# Patient Record
Sex: Male | Born: 1990 | Race: White | Hispanic: No | Marital: Single | Smoking: Current every day smoker
Health system: Southern US, Community
[De-identification: ages and names within clinical notes are randomized; demographics above are authoritative.]

---
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
## 2010-12-30 ENCOUNTER — Emergency Department (HOSPITAL_COMMUNITY)
Admission: EM | Admit: 2010-12-30 | Discharge: 2010-12-30 | Payer: Self-pay | Source: Home / Self Care | Admitting: Emergency Medicine

## 2013-03-30 ENCOUNTER — Emergency Department (HOSPITAL_COMMUNITY): Payer: No Typology Code available for payment source

## 2013-03-30 ENCOUNTER — Emergency Department (HOSPITAL_COMMUNITY)
Admission: EM | Admit: 2013-03-30 | Discharge: 2013-03-30 | Disposition: A | Discharge status: Home / Self Care | Payer: No Typology Code available for payment source | Attending: Emergency Medicine | Admitting: Emergency Medicine

## 2013-03-30 ENCOUNTER — Encounter (HOSPITAL_COMMUNITY): Payer: Self-pay | Admitting: *Deleted

## 2013-03-30 DIAGNOSIS — S46812A Strain of other muscles, fascia and tendons at shoulder and upper arm level, left arm, initial encounter: Secondary | ICD-10-CM

## 2013-03-30 DIAGNOSIS — S81012A Laceration without foreign body, left knee, initial encounter: Secondary | ICD-10-CM

## 2013-03-30 DIAGNOSIS — Y9289 Other specified places as the place of occurrence of the external cause: Secondary | ICD-10-CM | POA: Insufficient documentation

## 2013-03-30 DIAGNOSIS — S8000XA Contusion of unspecified knee, initial encounter: Secondary | ICD-10-CM | POA: Insufficient documentation

## 2013-03-30 DIAGNOSIS — S43499A Other sprain of unspecified shoulder joint, initial encounter: Secondary | ICD-10-CM | POA: Insufficient documentation

## 2013-03-30 DIAGNOSIS — S81009A Unspecified open wound, unspecified knee, initial encounter: Secondary | ICD-10-CM | POA: Insufficient documentation

## 2013-03-30 DIAGNOSIS — Y9389 Activity, other specified: Secondary | ICD-10-CM | POA: Insufficient documentation

## 2013-03-30 DIAGNOSIS — Z23 Encounter for immunization: Secondary | ICD-10-CM | POA: Insufficient documentation

## 2013-03-30 DIAGNOSIS — S8002XA Contusion of left knee, initial encounter: Secondary | ICD-10-CM

## 2013-03-30 DIAGNOSIS — F17200 Nicotine dependence, unspecified, uncomplicated: Secondary | ICD-10-CM | POA: Insufficient documentation

## 2013-03-30 MED ORDER — METHOCARBAMOL 500 MG PO TABS: 500.0000 mg | ORAL_TABLET | Freq: Two times a day (BID) | ORAL | Status: DC

## 2013-03-30 MED ORDER — TETANUS-DIPHTH-ACELL PERTUSSIS 5-2.5-18.5 LF-MCG/0.5 IM SUSP
0.5000 mL | Freq: Once | INTRAMUSCULAR | Status: AC
  Administered 2013-03-30: 0.5 mL via INTRAMUSCULAR
  Filled 2013-03-30: qty 0.5

## 2013-03-30 MED ORDER — IBUPROFEN 800 MG PO TABS: 800.0000 mg | ORAL_TABLET | Freq: Three times a day (TID) | ORAL | Status: DC | PRN
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
## 2013-03-30 NOTE — ED Provider Notes (Signed)
Medical screening examination/treatment/procedure(s) were performed by non-physician practitioner and as supervising physician I was immediately available for consultation/collaboration.  Aveon Colquhoun, MD 03/30/13 0714 
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
## 2013-04-29 ENCOUNTER — Emergency Department (HOSPITAL_COMMUNITY): Payer: No Typology Code available for payment source

## 2013-04-29 ENCOUNTER — Emergency Department (HOSPITAL_COMMUNITY)
Admission: EM | Admit: 2013-04-29 | Discharge: 2013-04-29 | Disposition: A | Discharge status: Home / Self Care | Payer: No Typology Code available for payment source | Attending: Emergency Medicine | Admitting: Emergency Medicine

## 2013-04-29 ENCOUNTER — Encounter (HOSPITAL_COMMUNITY): Payer: Self-pay

## 2013-04-29 DIAGNOSIS — Y9389 Activity, other specified: Secondary | ICD-10-CM | POA: Insufficient documentation

## 2013-04-29 DIAGNOSIS — F17200 Nicotine dependence, unspecified, uncomplicated: Secondary | ICD-10-CM | POA: Insufficient documentation

## 2013-04-29 DIAGNOSIS — M79641 Pain in right hand: Secondary | ICD-10-CM

## 2013-04-29 DIAGNOSIS — S298XXA Other specified injuries of thorax, initial encounter: Secondary | ICD-10-CM | POA: Insufficient documentation

## 2013-04-29 DIAGNOSIS — S6990XA Unspecified injury of unspecified wrist, hand and finger(s), initial encounter: Secondary | ICD-10-CM | POA: Insufficient documentation

## 2013-04-29 DIAGNOSIS — R079 Chest pain, unspecified: Secondary | ICD-10-CM

## 2013-04-29 MED ORDER — IBUPROFEN 800 MG PO TABS: 800.0000 mg | ORAL_TABLET | Freq: Three times a day (TID) | ORAL | Status: DC
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
## 2013-05-01 NOTE — ED Provider Notes (Signed)
Medical screening examination/treatment/procedure(s) were performed by non-physician practitioner and as supervising physician I was immediately available for consultation/collaboration.   Loren Racer, MD 05/01/13 [phone]

## 2013-11-09 ENCOUNTER — Encounter (HOSPITAL_COMMUNITY): Payer: Self-pay | Admitting: Emergency Medicine

## 2013-11-09 ENCOUNTER — Emergency Department (HOSPITAL_COMMUNITY)
Admission: EM | Admit: 2013-11-09 | Discharge: 2013-11-09 | Disposition: A | Discharge status: Home / Self Care | Payer: Self-pay | Attending: Emergency Medicine | Admitting: Emergency Medicine

## 2013-11-09 ENCOUNTER — Emergency Department (HOSPITAL_COMMUNITY): Payer: Self-pay

## 2013-11-09 DIAGNOSIS — M545 Low back pain, unspecified: Secondary | ICD-10-CM | POA: Insufficient documentation

## 2013-11-09 DIAGNOSIS — M549 Dorsalgia, unspecified: Secondary | ICD-10-CM

## 2013-11-09 DIAGNOSIS — F17200 Nicotine dependence, unspecified, uncomplicated: Secondary | ICD-10-CM | POA: Insufficient documentation

## 2013-11-09 MED ORDER — CYCLOBENZAPRINE HCL 10 MG PO TABS: 10.0000 mg | ORAL_TABLET | Freq: Two times a day (BID) | ORAL | Status: AC | PRN

## 2013-11-09 MED ORDER — NAPROXEN 500 MG PO TABS: 500.0000 mg | ORAL_TABLET | Freq: Two times a day (BID) | ORAL | Status: AC

## 2013-11-09 NOTE — ED Notes (Signed)
Pt works at  junk [employer]  And is unable to perform his work duties. Pt complains of right back pain Pt states his pain flares up every 10 min.

## 2013-11-09 NOTE — ED Notes (Signed)
Pt denies urinary symptoms. No hx of kidney stones.

## 2013-11-09 NOTE — ED Provider Notes (Signed)
CSN: [id number]     Arrival date & time 11/09/13  0945 History   First MD Initiated Contact with Patient 11/09/13 1015     No chief complaint on file.  (Consider location/radiation/quality/duration/timing/severity/associated sxs/prior Treatment) The history is provided by the patient and a parent. No language interpreter was used.  CRESPIN FORSTROM is a 22 year old male with no significant past medical history presenting to emergency department, with mother, regarding lower back pain that has been ongoing for the past week. As per patient, reported that the pain is localized in the middle of his back, lower described as a sharp shooting pain that occurs every 10 minutes. Patient reports that the pain is localized to this area without radiation. Patient reports that nothing makes the pain better, reports that pain is worsens with picking up objects and bending over. Patient reports that he's been using ibuprofen with minimal relief. 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 Patient denied any previous history of back pain. Denied falls, injury, numbness and tingling to the extremities, neck pain, neck stiffness, weakness, dizziness, headache, chest pain, shortness of breath, difficulty breathing, fever, IV drug abuse, urinary and bowel incontinence. PCP none  History reviewed. No pertinent past medical history. History reviewed. No pertinent past surgical history. History reviewed. No pertinent family history. History  Substance Use Topics  . Smoking status: Current Every Day Smoker    Types: Cigarettes  . Smokeless tobacco: Not on file  . Alcohol Use: No    Review of Systems  Constitutional: Negative for fever and chills.  Respiratory: Negative for chest tightness and shortness of breath.   Cardiovascular: Negative for chest pain.  Genitourinary: Negative for urgency and decreased urine  volume.  Musculoskeletal: Positive for back pain. Negative for neck pain.  Neurological: Negative for dizziness, weakness and headaches.  All other systems reviewed and are negative.    Allergies  Review of patient's allergies indicates no known allergies.  Home Medications   Current Outpatient Rx  Name  Route  Sig  Dispense  Refill  . ibuprofen (ADVIL,MOTRIN) 200 MG tablet   Oral   Take 800 mg by mouth every 6 (six) hours as needed for fever, headache, mild pain or moderate pain.         . cyclobenzaprine (FLEXERIL) 10 MG tablet   Oral   Take 1 tablet (10 mg total) by mouth 2 (two) times daily as needed for muscle spasms.   20 tablet   0   . naproxen (NAPROSYN) 500 MG tablet   Oral   Take 1 tablet (500 mg total) by mouth 2 (two) times daily.   30 tablet   0    BP 112/49  Pulse 56  Temp(Src) 98 F (36.7 C) (Oral)  Resp 16  SpO2 99% Physical Exam  Nursing note and vitals reviewed. Constitutional: He is oriented to person, place, and time. He appears well-developed and well-nourished. No distress.  HENT:  Head: Normocephalic and atraumatic.  Neck: Normal range of motion. Neck supple.  Negative pain upon palpation to the C-spine Negative neck stiffness  Cardiovascular: Normal rate, regular rhythm and normal heart sounds.  Exam reveals no friction rub.   No murmur heard. Pulses:      Radial pulses are 2+ on the right side, and 2+ on the left side.       Dorsalis pedis pulses are 2+ on  the right side, and 2+ on the left side.  Pulmonary/Chest: Effort normal and breath sounds normal. No respiratory distress. He has no wheezes. He has no rales.  Musculoskeletal: Normal range of motion.       Back:  Negative swelling, erythema, inflammation, sores, lesions, bulging, deformities noted to the cervical, thoracic, lumbar spine. Discomfort upon palpation to mid spinal region localized to the lower thoracic upper lumbar region. Negative paraspinal discomfort identified.  Discomfort with rotation of torso and flexion. Patient able to bear weight on both legs without difficulty. Full range of motion to upper and lower extremities bilaterally without difficulty noted.  Neurological: He is alert and oriented to person, place, and time. He exhibits normal muscle tone. Coordination normal.  Strength 5+/5+ to upper and lower extremities bilaterally with resistance applied, equal distribution noted Sensation intact with differentiation to sharp and dull touch to upper and lower extremities bilaterally Stance strong, negative sway Gait proper, proper balance Negative step off  Skin: Skin is warm and dry. No rash noted. He is not diaphoretic. No erythema.  Psychiatric: He has a normal mood and affect. His behavior is normal. Thought content normal.    ED Course  Procedures (including critical care time) Labs Review Labs Reviewed - No data to display Imaging Review Dg Thoracic Spine 2 View  11/09/2013   CLINICAL DATA:  No injury.  Back pain.  EXAM: THORACIC SPINE - 2 VIEW  COMPARISON:  Chest x-ray 04/29/2013  FINDINGS: There is no evidence of thoracic spine fracture. Alignment is normal. No other significant bone abnormalities are identified.  IMPRESSION: Negative.   Electronically Signed   By: Charlett Nose M.D.   On: 11/09/2013 11:15   Dg Lumbar Spine Complete  11/09/2013   CLINICAL DATA:  Lower back pain.  EXAM: LUMBAR SPINE - COMPLETE 4+ VIEW  COMPARISON:  None.  FINDINGS: There is no evidence of lumbar spine fracture. Alignment is normal. Intervertebral disc spaces are maintained.  IMPRESSION: Negative.   Electronically Signed   By: Salome Holmes M.D.   On: 11/09/2013 11:11    EKG Interpretation   None       MDM   1. Back pain     Filed Vitals:   11/09/13 0950 11/09/13 0959 11/09/13 1000  BP: 123/66 119/58 112/49  Pulse: 56 56   Temp: 97.5 F (36.4 C) 98 F (36.7 C)   TempSrc: Oral Oral   Resp: 16 16   SpO2: 100% 99%     Patient presenting  to emergency department with back pain has been ongoing for the past week. Alert and oriented. GCS 15. Negative deformities noted to the cervical, thoracic, lumbar sacral spine. Discomfort upon palpation to lower thoracic, upper lumbar region of the spine-mid spinal region-negative pain upon palpation to bilateral paraspinal regions. Full range of motion to upper and lower extremities bilaterally without difficulty. Discomfort noted with rotation of the torso and flexion. Strength intact. Sensation intact. Pulses palpable. Negative neurological deficits noted. Plain films of thoracic and lumbar spine negative for abnormalities. Alignment is normal, and vertebral disc spaces are maintained. Doubt epidural abscess. Doubt cauda equina syndrome. Suspicion to be back pain secondary to work. Patient stable, afebrile. Discharged patient. Discharged patient with anti-inflammatories and muscle relaxers. Referred patient to orthopedics. Recommended back brace. Discussed with patient to avoid any strenuous physical activity. Discussed with patient to closely monitor symptoms and if symptoms are to worsen or change report back to emergency department - strict return instructions given. Patient agreed  to plan of care, understood, all questions answered.    Raymon Mutton, PA-C 11/10/13 1520

## 2013-11-11 NOTE — ED Provider Notes (Signed)
Medical screening examination/treatment/procedure(s) were performed by non-physician practitioner and as supervising physician I was immediately available for consultation/collaboration.  EKG Interpretation   None         Sameka Bagent, MD 11/11/13 0644 

## 2017-12-01 ENCOUNTER — Encounter (HOSPITAL_COMMUNITY): Payer: Self-pay

## 2017-12-01 ENCOUNTER — Emergency Department (HOSPITAL_COMMUNITY)
Admission: EM | Admit: 2017-12-01 | Discharge: 2017-12-01 | Disposition: A | Discharge status: Home / Self Care | Payer: Self-pay | Attending: Emergency Medicine | Admitting: Emergency Medicine

## 2017-12-01 ENCOUNTER — Other Ambulatory Visit: Payer: Self-pay

## 2017-12-01 DIAGNOSIS — K029 Dental caries, unspecified: Secondary | ICD-10-CM | POA: Insufficient documentation

## 2017-12-01 DIAGNOSIS — K047 Periapical abscess without sinus: Secondary | ICD-10-CM | POA: Insufficient documentation

## 2017-12-01 DIAGNOSIS — F17210 Nicotine dependence, cigarettes, uncomplicated: Secondary | ICD-10-CM | POA: Insufficient documentation

## 2017-12-01 DIAGNOSIS — Z79899 Other long term (current) drug therapy: Secondary | ICD-10-CM | POA: Insufficient documentation

## 2017-12-01 MED ORDER — IBUPROFEN 600 MG PO TABS: 600.0000 mg | ORAL_TABLET | Freq: Four times a day (QID) | ORAL | 0 refills | Status: DC | PRN

## 2017-12-01 MED ORDER — PENICILLIN V POTASSIUM 500 MG PO TABS: 500.0000 mg | ORAL_TABLET | Freq: Three times a day (TID) | ORAL | 0 refills | Status: AC

## 2017-12-01 NOTE — ED Triage Notes (Addendum)
Onset 3 days upper front tooth pain and right side facial swelling.

## 2017-12-01 NOTE — ED Provider Notes (Signed)
[facility]   CSN: [id number] Arrival date & time: 12/01/17  2152     History   Chief Complaint Chief Complaint  Patient presents with  . Dental Pain    HPI Danny Carter is a 26 y.o. male.  HPI   26 year old male presenting for evaluation of dental pain.  Patient report for the past 3 days he has had progressive worsening pain to his right upper teeth.  Pain is described as a achy throbbing sensation, persistent, sometimes worsen with temperature change.  Increasing pain with chewing.  He has been taking Tylenol and ibuprofen at home with some relief.  He has had dental pain to other teeth in the past.  He knows he needs to follow-up with a dentist but unable to afford at this time.  He is currently waiting for Medicaid.  He denies any associated fever, trouble swallowing, or neck pain.  No chest pain or trouble breathing.  He is a smoker.  History reviewed. No pertinent past medical history.  There are no active problems to display for this patient.   History reviewed. No pertinent surgical history.     Home Medications    Prior to Admission medications   Medication Sig Start Date End Date Taking? Authorizing Provider  cyclobenzaprine (FLEXERIL) 10 MG tablet Take 1 tablet (10 mg total) by mouth 2 (two) times daily as needed for muscle spasms. 11/09/13   Sciacca, Marissa, PA-C  ibuprofen (ADVIL,MOTRIN) 200 MG tablet Take 800 mg by mouth every 6 (six) hours as needed for fever, headache, mild pain or moderate pain.    [provider]  naproxen (NAPROSYN) 500 MG tablet Take 1 tablet (500 mg total) by mouth 2 (two) times daily. 11/09/13   Raymon MuttonSciacca, Marissa, PA-C    Family History History reviewed. No pertinent family history.  Social History Social History   Tobacco Use  . Smoking status: Current Every Day Smoker    Packs/day: 0.50    Types: Cigarettes  . Smokeless tobacco: Never Used  Substance Use Topics   . Alcohol use: No  . Drug use: No     Allergies   Patient has no known allergies.   Review of Systems Review of Systems  Constitutional: Negative for fever.  HENT: Positive for dental problem.      Physical Exam Updated Vital Signs BP 118/66   Pulse 75   Temp 97.9 F (36.6 C) (Oral)   Resp 16   SpO2 100%   Physical Exam  Constitutional: He appears well-developed and well-nourished. No distress.  HENT:  Head: Atraumatic.  Widespread dental decay in mouth with tenderness to tooth #6-9.  Upper gumline is edematous with associated facial swelling.  No trismus.  Eyes: Conjunctivae are normal.  Neck: Neck supple.  Neurological: He is alert.  Skin: No rash noted.  Psychiatric: He has a normal mood and affect.  Nursing note and vitals reviewed.    ED Treatments / Results  Labs (all labs ordered are listed, but only abnormal results are displayed) Labs Reviewed - No data to display  EKG  EKG Interpretation None       Radiology No results found.  Procedures Procedures (including critical care time)  Medications Ordered in ED Medications - No data to display   Initial Impression / Assessment and Plan / ED Course  I have reviewed the triage vital signs and the nursing notes.  Pertinent labs & imaging results that were available during my  care of the patient were reviewed by me and considered in my medical decision making (see chart for details).     BP 118/66   Pulse 75   Temp 97.9 F (36.6 C) (Oral)   Resp 16   SpO2 100%    Final Clinical Impressions(s) / ED Diagnoses   Final diagnoses:  Infected dental caries    ED Discharge Orders    None     Pt presents c/o dental pain. There is no evidence of abscess, no trismus, and the uvula is midline.  The patient will be given prescriptions for antibiotics and analgesics.  The patient was counseled to follow up with a dentist for further treatment.  The patient was informed to return to the ED  if there is interval development of hoarseness, trismus, erythema, difficulty breathing, swelling of the neck or worsening pain. The patient verbalized understanding of the plan and agrees.  The patient was provided the opportunity to ask questions.  All questions were answered.    Fayrene Helperran, Yvana Samonte, PA-C 12/01/17 2216    Raeford RazorKohut, Stephen, MD 12/04/17 1130

## 2017-12-25 ENCOUNTER — Emergency Department (HOSPITAL_COMMUNITY): Payer: Medicaid Other

## 2017-12-25 ENCOUNTER — Encounter (HOSPITAL_COMMUNITY): Payer: Self-pay | Admitting: Pharmacy Technician

## 2017-12-25 ENCOUNTER — Emergency Department (HOSPITAL_COMMUNITY)
Admission: EM | Admit: 2017-12-25 | Discharge: 2017-12-25 | Disposition: A | Discharge status: Home / Self Care | Payer: Medicaid Other | Attending: Emergency Medicine | Admitting: Emergency Medicine

## 2017-12-25 DIAGNOSIS — Z79899 Other long term (current) drug therapy: Secondary | ICD-10-CM | POA: Insufficient documentation

## 2017-12-25 DIAGNOSIS — Y999 Unspecified external cause status: Secondary | ICD-10-CM | POA: Insufficient documentation

## 2017-12-25 DIAGNOSIS — F17210 Nicotine dependence, cigarettes, uncomplicated: Secondary | ICD-10-CM | POA: Insufficient documentation

## 2017-12-25 DIAGNOSIS — Y939 Activity, unspecified: Secondary | ICD-10-CM | POA: Insufficient documentation

## 2017-12-25 DIAGNOSIS — S4991XA Unspecified injury of right shoulder and upper arm, initial encounter: Secondary | ICD-10-CM | POA: Diagnosis present

## 2017-12-25 DIAGNOSIS — Y929 Unspecified place or not applicable: Secondary | ICD-10-CM | POA: Insufficient documentation

## 2017-12-25 DIAGNOSIS — S42001A Fracture of unspecified part of right clavicle, initial encounter for closed fracture: Secondary | ICD-10-CM | POA: Diagnosis not present

## 2017-12-25 DIAGNOSIS — R55 Syncope and collapse: Secondary | ICD-10-CM | POA: Insufficient documentation

## 2017-12-25 MED ORDER — IBUPROFEN 800 MG PO TABS: 800.0000 mg | ORAL_TABLET | Freq: Three times a day (TID) | ORAL | 0 refills | Status: AC | PRN

## 2017-12-25 MED ORDER — OXYCODONE-ACETAMINOPHEN 5-325 MG PO TABS
2.0000 | ORAL_TABLET | Freq: Once | ORAL | Status: AC
Start: 2017-12-25 — End: 2017-12-25
  Administered 2017-12-25: 2 via ORAL
  Filled 2017-12-25: qty 2

## 2017-12-25 MED ORDER — OXYCODONE-ACETAMINOPHEN 5-325 MG PO TABS: 1.0000 | ORAL_TABLET | Freq: Four times a day (QID) | ORAL | 0 refills | Status: AC | PRN

## 2017-12-25 NOTE — Progress Notes (Signed)
Orthopedic Tech Progress Note Patient Details:  Domingo SepJacob B Lawry Feb 07, 1991 [id number]  Ortho Devices Type of Ortho Device: Arm sling Ortho Device/Splint Location: Right arm Ortho Device/Splint Interventions: Application, Adjustment   Post Interventions Patient Tolerated: Well Instructions Provided: Adjustment of device, Care of device   Alvina ChouWilliams, Elward Nocera C 12/25/2017, 7:00 PM
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
## 2017-12-25 NOTE — ED Notes (Signed)
Declined W/C at D/C and was escorted to lobby by RN. 

## 2017-12-25 NOTE — Discharge Instructions (Signed)
It was my pleasure taking care of you today!   Call the orthopedic clinic tomorrow morning to schedule a follow up appointment.   Ibuprofen as needed for mild to moderate pain. Percocet only as needed for severe pain - This can make you very drowsy - please do not drink alcohol, operate heavy machinery or drive on this medication.   Return to ER for new or worsening symptoms, any additional concerns.

## 2017-12-27 ENCOUNTER — Ambulatory Visit (INDEPENDENT_AMBULATORY_CARE_PROVIDER_SITE_OTHER): Payer: Medicaid Other | Admitting: Physician Assistant

## 2017-12-27 ENCOUNTER — Encounter (INDEPENDENT_AMBULATORY_CARE_PROVIDER_SITE_OTHER): Payer: Self-pay | Admitting: Physician Assistant

## 2017-12-27 DIAGNOSIS — S42021A Displaced fracture of shaft of right clavicle, initial encounter for closed fracture: Secondary | ICD-10-CM
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
## 2018-01-09 ENCOUNTER — Ambulatory Visit (INDEPENDENT_AMBULATORY_CARE_PROVIDER_SITE_OTHER): Payer: Self-pay | Admitting: Physician Assistant

## 2018-01-09 ENCOUNTER — Ambulatory Visit (INDEPENDENT_AMBULATORY_CARE_PROVIDER_SITE_OTHER): Payer: Self-pay

## 2018-01-09 ENCOUNTER — Encounter (INDEPENDENT_AMBULATORY_CARE_PROVIDER_SITE_OTHER): Payer: Self-pay | Admitting: Physician Assistant

## 2018-01-09 DIAGNOSIS — S42021A Displaced fracture of shaft of right clavicle, initial encounter for closed fracture: Secondary | ICD-10-CM

## 2018-01-09 MED ORDER — HYDROCODONE-ACETAMINOPHEN 5-325 MG PO TABS: 1.0000 | ORAL_TABLET | Freq: Four times a day (QID) | ORAL | 0 refills | Status: AC | PRN

## 2018-01-09 NOTE — Progress Notes (Signed)
Danny Carter returns today for follow-up of his right clavicle fracture.  He continues to have significant pain in the clavicle.  He does bring the arm out of his sling for range of motion.  He is asking for refill on his pain medication today.  Physical exam: Right arm is good sensation in the hand full motor.  Right clavicle he has tenderness over the clavicle shaft where there was some palpable callus.  There is no tenting of the skin.  Radiographs: Right clavicle 2 views shows no change in overall position alignment of the clavicle shaft fracture.  There are some early signs of callus formation.  No other fractures seen.  Shoulder appears well located.  Impression: Right clavicle shaft fracture 2 weeks status post injury.  Plan: He will continue to come out of the sling for range of motion of the arm forearm hand and wrist.  He is given Norco for pain that he is use sparingly.  We did place him out of work until he follows up with us in 1 month we will obtain 2 views of the clavicle at that time.  Questions encouraged and answered at length.

## 2018-02-06 ENCOUNTER — Encounter (INDEPENDENT_AMBULATORY_CARE_PROVIDER_SITE_OTHER): Payer: Self-pay | Admitting: Physician Assistant

## 2018-02-06 ENCOUNTER — Ambulatory Visit (INDEPENDENT_AMBULATORY_CARE_PROVIDER_SITE_OTHER): Payer: Medicaid Other

## 2018-02-06 ENCOUNTER — Ambulatory Visit (INDEPENDENT_AMBULATORY_CARE_PROVIDER_SITE_OTHER): Payer: Medicaid Other | Admitting: Physician Assistant

## 2018-02-06 DIAGNOSIS — S42021D Displaced fracture of shaft of right clavicle, subsequent encounter for fracture with routine healing: Secondary | ICD-10-CM

## 2018-02-06 NOTE — Progress Notes (Signed)
   Office Visit Note   Patient: Danny Carter           Date of Birth: [birth year]           MRN: [id number] Visit Date: 02/06/2018              Requested by: No referring provider defined for this encounter. PCP: Patient, No Pcp Per   Assessment & Plan: Visit Diagnoses:  1. Closed displaced fracture of shaft of right clavicle with routine healing, subsequent encounter     Plan: No heavy lifting with the right arm.  He can do activities as tolerated chest level.  I would like for him to work on gentle range of motion including forward flexion and abduction he is ranges of motion were demonstrated the patient today and included wall crawls.  We will see him back in 1 month for 2 views of the right clavicle.  Follow-Up Instructions: Return in about 4 weeks (around 03/06/2018) for Radiographs.   Orders:  Orders Placed This Encounter  Procedures  . XR Clavicle Right   No orders of the defined types were placed in this encounter.     Procedures: No procedures performed   Clinical Data: No additional findings.   Subjective: Chief Complaint  Patient presents with  . Right Shoulder - Follow-up    HPI Danny Carter returns today 6 weeks status post closed right clavicle fracture.  States overall the knee is doing okay his soreness is dissipating.  He has returned to work as of yesterday and is doing activities at chest level.  He is no longer wearing sling.  Review of Systems See HPI  Objective: Vital Signs: There were no vitals taken for this visit.  Physical Exam  Constitutional: He is oriented to person, place, and time. He appears well-developed and well-nourished. No distress.  Pulmonary/Chest: Effort normal.  Neurological: He is alert and oriented to person, place, and time.  Skin: He is not diaphoretic.    Ortho Exam Right clavicle he has palpable callus at the fracture site.  Some slight tenderness.  Abduction right shoulder 90 degrees and forward flexion to 90 degrees  actively passively I can bring him to about 170 degrees. Specialty Comments:  No specialty comments available.  Imaging: Xr Clavicle Right  Result Date: 02/06/2018 3 views right clavicle: No change in overall position alignment.  Further consolidation is seen.  Fracture is still quite evident with shortening of the overall length of the shaft.    PMFS History: There are no active problems to display for this patient.  History reviewed. No pertinent past medical history.  History reviewed. No pertinent family history.  History reviewed. No pertinent surgical history. Social History   Occupational History  . Not on file  Tobacco Use  . Smoking status: Current Every Day Smoker    Packs/day: 0.50    Types: Cigarettes  . Smokeless tobacco: Never Used  Substance and Sexual Activity  . Alcohol use: No  . Drug use: No  . Sexual activity: Not on file

## 2018-03-06 ENCOUNTER — Ambulatory Visit (INDEPENDENT_AMBULATORY_CARE_PROVIDER_SITE_OTHER): Payer: Medicaid Other | Admitting: Physician Assistant

## 2019-05-26 ENCOUNTER — Other Ambulatory Visit (HOSPITAL_COMMUNITY): Admission: RE | Admit: 2019-05-26 | Payer: Medicaid Other | Source: Ambulatory Visit

## 2019-05-26 NOTE — Progress Notes (Signed)
Spoke with Glass blower/designer at Dr. Lupita Leash office, explained unable to reach pt for surgery on Wednesday and pt needs Covid test prior to surgery.

## 2019-05-27 NOTE — Progress Notes (Signed)
Spoke with Receptionist at Dr. Lupita Leash office, told her needed to cancel pt for tomorrow. We have not heard back from pt to do history and he would need COVID test before surgery. Just got work we are not doing rapid Covid test only 72 hour Covid test for surgery. She will share information with Midwife.

## 2019-05-28 ENCOUNTER — Encounter (HOSPITAL_BASED_OUTPATIENT_CLINIC_OR_DEPARTMENT_OTHER): Admission: RE | Payer: Self-pay | Source: Home / Self Care

## 2019-05-28 ENCOUNTER — Ambulatory Visit (HOSPITAL_BASED_OUTPATIENT_CLINIC_OR_DEPARTMENT_OTHER): Admission: RE | Admit: 2019-05-28 | Payer: Medicaid Other | Source: Home / Self Care | Admitting: Oral Surgery

## 2019-05-28 SURGERY — DENTAL RESTORATION/EXTRACTIONS
Anesthesia: General
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
## 2020-05-10 ENCOUNTER — Encounter (HOSPITAL_COMMUNITY): Payer: Self-pay | Admitting: *Deleted

## 2020-05-10 ENCOUNTER — Emergency Department (HOSPITAL_COMMUNITY)
Admission: EM | Admit: 2020-05-10 | Discharge: 2020-05-10 | Disposition: A | Discharge status: Home / Self Care | Payer: Medicaid Other | Attending: Emergency Medicine | Admitting: Emergency Medicine

## 2020-05-10 DIAGNOSIS — K0889 Other specified disorders of teeth and supporting structures: Secondary | ICD-10-CM | POA: Insufficient documentation

## 2020-05-10 DIAGNOSIS — F17210 Nicotine dependence, cigarettes, uncomplicated: Secondary | ICD-10-CM | POA: Insufficient documentation

## 2020-05-10 DIAGNOSIS — K029 Dental caries, unspecified: Secondary | ICD-10-CM

## 2020-05-10 MED ORDER — AMOXICILLIN 500 MG PO CAPS: 500.0000 mg | ORAL_CAPSULE | Freq: Three times a day (TID) | ORAL | 0 refills | Status: AC

## 2020-05-10 NOTE — ED Triage Notes (Signed)
To ED for eval of left lower tooth pain. States he knows its infected. Pain started today.

## 2020-05-10 NOTE — Discharge Instructions (Signed)
Please pick up antibiotics and take as prescribed Attached is a dental resource guide - please follow up with a dentist as you may need some teeth extracted I would recommend 800 mg Ibuprofen every 8 hours and 1,000 mg Tylenol every 8 hours (alternate the medications every 4 hours - Ibuprofen then 4 hours later Tylenol then 4 hours later it will be time to take Ibuprofen again) Applying ice to your face can reduce pain and swelling as well Return to the ED for any worsening symptoms including worsening pain, facial swelling, fevers > 100.4, bad taste in your mouth/pus draining from your tooth, or any other new/concerning symptoms

## 2020-05-10 NOTE — ED Provider Notes (Signed)
[facility] EMERGENCY DEPARTMENT Provider Note   CSN: [id number] Arrival date & time: 05/10/20  1233     History Chief Complaint  Patient presents with  . Dental Pain    Danny Carter is a 29 y.o. male who presents to the ED today with complaint of gradual onset, constant, achy, left lower dental pain that began yesterday. Pt reports hx of multiple dental caries and states he will frequently get pain leading to an infection. No trauma to the teeth. Pt does not have a dentist to follow up with. He has been taking 800 mg Ibuprofen once daily as well as Tylenol once daily without relief. Pt denies any fevers, chills, drainage from tooth, foul taste in mouth, trismus, drooling, facial swelling, or any other associated symptoms.   The history is provided by the patient and medical records.       History reviewed. No pertinent past medical history.  There are no problems to display for this patient.   History reviewed. No pertinent surgical history.     No family history on file.  Social History   Tobacco Use  . Smoking status: Current Every Day Smoker    Packs/day: 0.50    Types: Cigarettes  . Smokeless tobacco: Never Used  Substance Use Topics  . Alcohol use: No  . Drug use: No    Home Medications Prior to Admission medications   Medication Sig Start Date End Date Taking? Authorizing Provider  amoxicillin (AMOXIL) 500 MG capsule Take 1 capsule (500 mg total) by mouth 3 (three) times daily. 05/10/20   Hyman Hopes, Hannan Tetzlaff, PA-C  cyclobenzaprine (FLEXERIL) 10 MG tablet Take 1 tablet (10 mg total) by mouth 2 (two) times daily as needed for muscle spasms. Patient not taking: Reported on 02/06/2018 11/09/13   Sciacca, Ashok Cordia, PA-C  HYDROcodone-acetaminophen (NORCO) 5-325 MG tablet Take 1 tablet by mouth every 6 (six) hours as needed for moderate pain. One to two tabs every 4-6 hours for pain Patient not taking: Reported on 02/06/2018 01/09/18   Kirtland Bouchard,  PA-C  ibuprofen (ADVIL,MOTRIN) 800 MG tablet Take 1 tablet (800 mg total) by mouth every 8 (eight) hours as needed for mild pain or moderate pain. 12/25/17   Ward, Chase Picket, PA-C  naproxen (NAPROSYN) 500 MG tablet Take 1 tablet (500 mg total) by mouth 2 (two) times daily. Patient not taking: Reported on 02/06/2018 11/09/13   Sciacca, Ashok Cordia, PA-C  oxyCODONE-acetaminophen (PERCOCET/ROXICET) 5-325 MG tablet Take 1-2 tablets by mouth every 6 (six) hours as needed for severe pain. Patient not taking: Reported on 02/06/2018 12/25/17   Ward, Chase Picket, PA-C  penicillin v potassium (VEETID) 500 MG tablet Take 1 tablet (500 mg total) by mouth 3 (three) times daily. Patient not taking: Reported on 02/06/2018 12/01/17   Fayrene Helper, PA-C    Allergies    Patient has no known allergies.  Review of Systems   Review of Systems  Constitutional: Negative for chills and fever.  HENT: Positive for dental problem. Negative for facial swelling, trouble swallowing and voice change.     Physical Exam Updated Vital Signs BP 124/71 (BP Location: Left Arm)   Pulse 65   Temp 98.9 F (37.2 C) (Oral)   Resp 16   Ht 5\' 11"  (1.803 m)   Wt 82.6 kg   SpO2 99%   BMI 25.38 kg/m   Physical Exam Vitals and nursing note reviewed.  Constitutional:      Appearance: He is not ill-appearing.  HENT:     Head: Normocephalic and atraumatic.     Mouth/Throat:      Comments: Nose clear.  L lower tooth #18 decayed with caries with TTP, with minimal surrounding gingival swelling and erythema, no definite abscess, no evidence of ludwig's.  Oropharynx clear and moist, without uvular swelling or deviation, no trismus or drooling, no tonsillar swelling or erythema, no exudates.   Eyes:     Conjunctiva/sclera: Conjunctivae normal.  Cardiovascular:     Rate and Rhythm: Normal rate and regular rhythm.     Heart sounds: No murmur.  Pulmonary:     Effort: Pulmonary effort is normal.     Breath sounds: Normal breath  sounds. No wheezing, rhonchi or rales.  Skin:    General: Skin is warm and dry.     Coloration: Skin is not jaundiced.  Neurological:     Mental Status: He is alert.     ED Results / Procedures / Treatments   Labs (all labs ordered are listed, but only abnormal results are displayed) Labs Reviewed - No data to display  EKG None  Radiology No results found.  Procedures Procedures (including critical care time)  Medications Ordered in ED Medications - No data to display  ED Course  I have reviewed the triage vital signs and the nursing notes.  Pertinent labs & imaging results that were available during my care of the patient were reviewed by me and considered in my medical decision making (see chart for details).    MDM Rules/Calculators/A&P                      29 year old male presenting to the ED with left lower dental pain x 1 day, hx of same with poor dentition. No trauma. On arrival to the ED pt is afebrile, nontachycardic, and nontachypneic. Appears to be in NAD. No facial swelling appreciated. No definitive dental abscess to be drained today; no concern for ludwig's angina. Pt tolerated his own secretions without difficulty. Noted dental caries throughout with tooth #18 small dental fracture and TTP. Will discharge with abx at this time and provide dental resource guide. Strict return precautions have been discussed with pt. He is in agreement with plan and stable for discharge home.   This note was prepared using Dragon voice recognition software and may include unintentional dictation errors due to the inherent limitations of voice recognition software.   Final Clinical Impression(s) / ED Diagnoses Final diagnoses:  Pain, dental  Dental caries    Rx / DC Orders ED Discharge Orders         Ordered    amoxicillin (AMOXIL) 500 MG capsule  3 times daily     05/10/20 1354           Discharge Instructions     Please pick up antibiotics and take as  prescribed Attached is a dental resource guide - please follow up with a dentist as you may need some teeth extracted I would recommend 800 mg Ibuprofen every 8 hours and 1,000 mg Tylenol every 8 hours (alternate the medications every 4 hours - Ibuprofen then 4 hours later Tylenol then 4 hours later it will be time to take Ibuprofen again) Applying ice to your face can reduce pain and swelling as well Return to the ED for any worsening symptoms including worsening pain, facial swelling, fevers > 100.4, bad taste in your mouth/pus draining from your tooth, or any other new/concerning symptoms  Tanda Rockers, PA-C 05/10/20 1401    Melene Plan, DO 05/10/20 1507

## 2020-08-10 ENCOUNTER — Other Ambulatory Visit: Payer: Self-pay

## 2020-08-10 ENCOUNTER — Ambulatory Visit (HOSPITAL_COMMUNITY)
Admission: EM | Admit: 2020-08-10 | Discharge: 2020-08-10 | Disposition: A | Discharge status: Home / Self Care | Payer: Medicaid Other
